# Patient Record
Sex: Male | Born: 1995 | Race: Black or African American | Hispanic: No | Marital: Single | Smoking: Never smoker
Health system: Southern US, Community
[De-identification: ages and names within clinical notes are randomized; demographics above are authoritative.]

---
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
## 2018-10-15 ENCOUNTER — Emergency Department (HOSPITAL_BASED_OUTPATIENT_CLINIC_OR_DEPARTMENT_OTHER)
Admission: EM | Admit: 2018-10-15 | Discharge: 2018-10-15 | Disposition: A | Discharge status: Home / Self Care | Payer: BC Managed Care – PPO | Attending: Emergency Medicine | Admitting: Emergency Medicine

## 2018-10-15 ENCOUNTER — Other Ambulatory Visit: Payer: Self-pay

## 2018-10-15 ENCOUNTER — Encounter (HOSPITAL_BASED_OUTPATIENT_CLINIC_OR_DEPARTMENT_OTHER): Payer: Self-pay | Admitting: Emergency Medicine

## 2018-10-15 DIAGNOSIS — Y998 Other external cause status: Secondary | ICD-10-CM | POA: Diagnosis not present

## 2018-10-15 DIAGNOSIS — Y9389 Activity, other specified: Secondary | ICD-10-CM | POA: Insufficient documentation

## 2018-10-15 DIAGNOSIS — S199XXA Unspecified injury of neck, initial encounter: Secondary | ICD-10-CM | POA: Diagnosis present

## 2018-10-15 DIAGNOSIS — S161XXA Strain of muscle, fascia and tendon at neck level, initial encounter: Secondary | ICD-10-CM | POA: Diagnosis not present

## 2018-10-15 DIAGNOSIS — Y92410 Unspecified street and highway as the place of occurrence of the external cause: Secondary | ICD-10-CM | POA: Insufficient documentation

## 2018-10-15 MED ORDER — METHOCARBAMOL 500 MG PO TABS: 500.0000 mg | ORAL_TABLET | Freq: Three times a day (TID) | ORAL | 0 refills | Status: AC | PRN

## 2018-10-15 NOTE — Discharge Instructions (Signed)
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
## 2020-03-18 ENCOUNTER — Encounter (HOSPITAL_BASED_OUTPATIENT_CLINIC_OR_DEPARTMENT_OTHER): Payer: Self-pay | Admitting: *Deleted

## 2020-03-18 ENCOUNTER — Emergency Department (HOSPITAL_BASED_OUTPATIENT_CLINIC_OR_DEPARTMENT_OTHER)
Admission: EM | Admit: 2020-03-18 | Discharge: 2020-03-18 | Disposition: A | Discharge status: Home / Self Care | Payer: BC Managed Care – PPO | Attending: Emergency Medicine | Admitting: Emergency Medicine

## 2020-03-18 ENCOUNTER — Other Ambulatory Visit: Payer: Self-pay

## 2020-03-18 DIAGNOSIS — H6123 Impacted cerumen, bilateral: Secondary | ICD-10-CM | POA: Insufficient documentation

## 2020-03-18 DIAGNOSIS — H9201 Otalgia, right ear: Secondary | ICD-10-CM | POA: Diagnosis present

## 2020-03-18 MED ORDER — DOCUSATE SODIUM 50 MG/5ML PO LIQD
50.0000 mg | Freq: Once | ORAL | Status: AC
  Administered 2020-03-18: 50 mg via OTIC
  Filled 2020-03-18: qty 10

## 2020-03-18 MED ORDER — CARBAMIDE PEROXIDE 6.5 % OT SOLN: 5.0000 [drp] | Freq: Two times a day (BID) | OTIC | 0 refills | Status: DC

## 2020-03-18 MED ORDER — CARBAMIDE PEROXIDE 6.5 % OT SOLN: 5.0000 [drp] | Freq: Two times a day (BID) | OTIC | 0 refills | Status: AC

## 2020-03-18 NOTE — ED Provider Notes (Signed)
[facility] EMERGENCY DEPARTMENT Provider Note   CSN: [id number] Arrival date & time: 03/18/20  1709     History Chief Complaint  Patient presents with  . Cerumen Impaction    Isaiah Davis is a 24 y.o. male.  Isaiah Davis is a 24 y.o. male who is otherwise healthy, presents to the ED for evaluation of muffled hearing in the right ear.  He reports he has a history of earwax buildup but is usually able to manage this with drops at home, has never had to come in to have wax removed.  He reports since this morning he has barely been able to hear out of his right ear.  He reports it is very muffled.  He reports some mild discomfort.  He has not noted any drainage from the ear.  No fevers or chills.  No other aggravating or alleviating factors.        History reviewed. No pertinent past medical history.  There are no problems to display for this patient.   History reviewed. No pertinent surgical history.     No family history on file.  Social History   Tobacco Use  . Smoking status: Never Smoker  . Smokeless tobacco: Never Used  Substance Use Topics  . Alcohol use: Never  . Drug use: Never    Home Medications Prior to Admission medications   Medication Sig Start Date End Date Taking? Authorizing Provider  carbamide peroxide (DEBROX) 6.5 % OTIC solution Place 5 drops into both ears 2 (two) times daily. 03/18/20   Dartha Lodge, PA-C  methocarbamol (ROBAXIN) 500 MG tablet Take 1 tablet (500 mg total) by mouth every 8 (eight) hours as needed for muscle spasms. 10/15/18   Couture, Cortni S, PA-C    Allergies    Patient has no known allergies.  Review of Systems   Review of Systems  Constitutional: Negative for chills and fever.  HENT: Positive for ear pain and hearing loss.     Physical Exam Updated Vital Signs BP (!) 143/97 (BP Location: Right Arm)   Pulse 77   Temp 98.5 F (36.9 C) (Oral)   Resp 16   Ht 5\' 10"  (1.778 m)   Wt 104.3 kg   SpO2  100%   BMI 33.00 kg/m   Physical Exam Vitals and nursing note reviewed.  Constitutional:      General: He is not in acute distress.    Appearance: Normal appearance. He is well-developed and normal weight. He is not ill-appearing or diaphoretic.  HENT:     Head: Normocephalic and atraumatic.     Ears:     Comments: Right ear canal with large amount of wax buildup, unable to visualize TM at all. Left ear canal with moderate amount of dried wax buildup, able to visualize small portion of intact TM Eyes:     General:        Right eye: No discharge.        Left eye: No discharge.  Pulmonary:     Effort: Pulmonary effort is normal. No respiratory distress.  Musculoskeletal:        General: No deformity.  Skin:    General: Skin is warm and dry.  Neurological:     Mental Status: He is alert and oriented to person, place, and time.     Coordination: Coordination normal.  Psychiatric:        Mood and Affect: Mood normal.        Behavior: Behavior normal.  ED Results / Procedures / Treatments   Labs (all labs ordered are listed, but only abnormal results are displayed) Labs Reviewed - No data to display  EKG None  Radiology No results found.  Procedures .Ear Cerumen Removal  Date/Time: 03/18/2020 10:03 PM Performed by: Jacqlyn Larsen, PA-C Authorized by: Jacqlyn Larsen, PA-C   Consent:    Consent obtained:  Verbal   Consent given by:  Patient   Risks discussed:  Bleeding, infection, incomplete removal, TM perforation, pain and dizziness   Alternatives discussed:  No treatment Procedure details:    Location:  L ear and R ear   Procedure type: irrigation     Procedure type comment:  Currette used in R ear Post-procedure details:    Inspection:  Macerated skin and TM intact   Hearing quality:  Improved   Patient tolerance of procedure:  Tolerated well, no immediate complications   (including critical care time)  Medications Ordered in ED Medications  docusate  (COLACE) 50 MG/5ML liquid 50 mg (50 mg Both EARS Given 03/18/20 2037)    ED Course  I have reviewed the triage vital signs and the nursing notes.  Pertinent labs & imaging results that were available during my care of the patient were reviewed by me and considered in my medical decision making (see chart for details).    MDM Rules/Calculators/A&P                      24 year old male presents with muffled hearing in the right ear.  Exam consistent with cerumen impaction he also has some buildup in the left ear as well.  History of same but usually he is able to keep this under control with eardrops and has never had to come into the ED to have wax removed.  Colace solution applied to both ears and then they were irrigated, patient with some soft wax still present over the right TM, additional irrigation and debridement with curette, after which the eardrum is visible and intact, some erythema as expected after procedure, but otherwise TM is intact.  Patient reports improvement in his hearing.  Will discharge with prescription for Debrox drops.  Follow-up and return precautions discussed.  Patient expresses understanding and agreement.  Discharged home in good condition.  Final Clinical Impression(s) / ED Diagnoses Final diagnoses:  Bilateral impacted cerumen    Rx / DC Orders ED Discharge Orders         Ordered    carbamide peroxide (DEBROX) 6.5 % OTIC solution  2 times daily     03/18/20 2207           Jacqlyn Larsen, PA-C 03/18/20 2210    Hayden Rasmussen, MD 03/19/20 [phone]

## 2020-03-18 NOTE — ED Triage Notes (Signed)
Unable to hear out of his right ear. States in the past when this happens it is caused by ear wax.

## 2020-03-18 NOTE — Discharge Instructions (Signed)
Use eardrops as directed for the next 5 to 7 days to help with any additional wax buildup.  After that you can use eardrops 2-3 times a week to help prevent further buildup.

## 2020-03-18 NOTE — ED Notes (Signed)
  Bilateral ears irrigated with saline/hydrogen peroxide solution.  Large amount of cerumen flushed out bilaterally.  Patient states his hearing is better and his ear feels better.

## 2020-10-10 ENCOUNTER — Ambulatory Visit: Payer: No Typology Code available for payment source | Attending: Internal Medicine

## 2020-10-10 DIAGNOSIS — Z23 Encounter for immunization: Secondary | ICD-10-CM

## 2020-10-10 NOTE — Progress Notes (Signed)
   Covid-19 Vaccination Clinic  Name:  Isaiah Davis    MRN: [id number] DOB: 1996/06/20  10/10/2020  Mr. Calk was observed post Covid-19 immunization for 15 minutes without incident. He was provided with Vaccine Information Sheet and instruction to access the V-Safe system.   Mr. Gracia was instructed to call 911 with any severe reactions post vaccine: Marland Kitchen Difficulty breathing  . Swelling of face and throat  . A fast heartbeat  . A bad rash all over body  . Dizziness and weakness   Immunizations Administered    Name Date Dose VIS Date Route   Pfizer COVID-19 Vaccine 10/10/2020  3:08 PM 0.3 mL 08/10/2020 Intramuscular   Manufacturer: [employer], [employer]   Lot: [id number]   NDC: [id number]

## 2020-10-29 ENCOUNTER — Other Ambulatory Visit: Payer: Self-pay

## 2020-10-29 DIAGNOSIS — Z20822 Contact with and (suspected) exposure to covid-19: Secondary | ICD-10-CM

## 2020-11-01 LAB — NOVEL CORONAVIRUS, NAA: SARS-CoV-2, NAA: DETECTED — AB

## 2021-06-05 ENCOUNTER — Ambulatory Visit: Payer: BC Managed Care – PPO | Admitting: Podiatry

## 2021-06-05 ENCOUNTER — Encounter: Payer: Self-pay | Admitting: Podiatry

## 2021-06-05 ENCOUNTER — Other Ambulatory Visit: Payer: Self-pay

## 2021-06-05 DIAGNOSIS — B353 Tinea pedis: Secondary | ICD-10-CM

## 2021-06-05 DIAGNOSIS — H9190 Unspecified hearing loss, unspecified ear: Secondary | ICD-10-CM | POA: Insufficient documentation

## 2021-06-05 MED ORDER — KETOCONAZOLE 2 % EX CREA: 1.0000 "application " | TOPICAL_CREAM | Freq: Two times a day (BID) | CUTANEOUS | 2 refills | Status: AC

## 2021-06-05 MED ORDER — KETOCONAZOLE 2 % EX CREA: 1.0000 "application " | TOPICAL_CREAM | Freq: Two times a day (BID) | CUTANEOUS | 2 refills | Status: DC

## 2021-06-05 NOTE — Addendum Note (Signed)
Addended byLilian Kapur, Jady Braggs R on: 06/05/2021 02:20 PM   Modules accepted: Orders

## 2021-06-05 NOTE — Progress Notes (Signed)
  Subjective:  Patient ID: Isaiah Davis, male    DOB: [birth year],  MRN: [id number]  Chief Complaint  Patient presents with   Blister     np-painful blisters on both feet and 5th right toe and 4th left toe-req day/time-dr. Windy Fast polite refer    25 y.o. male presents with the above complaint. History confirmed with patient.   Objective:  Physical Exam: warm, good capillary refill, no trophic changes or ulcerative lesions, normal DP and PT pulses, normal sensory exam, and tinea pedis. Assessment:   1. Tinea pedis of both feet      Plan:  Patient was evaluated and treated and all questions answered.  Discussed the etiology and treatment options for tinea pedis.  Discussed topical and oral treatment.  Recommended topical treatment with 2% ketoconazole cream.  This was sent to the patient's pharmacy.  Also discussed appropriate foot hygiene, use of antifungal spray such as Tinactin in shoes, as well as cleaning foot surfaces such as showers and bathroom floors with bleach.  Will call if does not resolve in 4-6 weeks and plan for 4 week course of lamisil if necessary    Return if symptoms worsen or fail to improve.

## 2021-08-07 ENCOUNTER — Other Ambulatory Visit: Payer: Self-pay | Admitting: Internal Medicine

## 2021-08-07 ENCOUNTER — Ambulatory Visit
Admission: RE | Admit: 2021-08-07 | Discharge: 2021-08-07 | Disposition: A | Discharge status: Home / Self Care | Payer: BC Managed Care – PPO | Source: Ambulatory Visit | Attending: Internal Medicine | Admitting: Internal Medicine

## 2021-08-07 ENCOUNTER — Other Ambulatory Visit: Payer: Self-pay

## 2021-08-07 DIAGNOSIS — M79672 Pain in left foot: Secondary | ICD-10-CM

## 2022-09-28 IMAGING — DX DG FOOT 2V*L*
2 series · 2 of 2 positions shown · non-contrast
Comparison: None.

CLINICAL DATA: Left foot pain.  No injury.

EXAM:
LEFT FOOT - 2 VIEW

[dg foot 2 views left (1 of 2)]
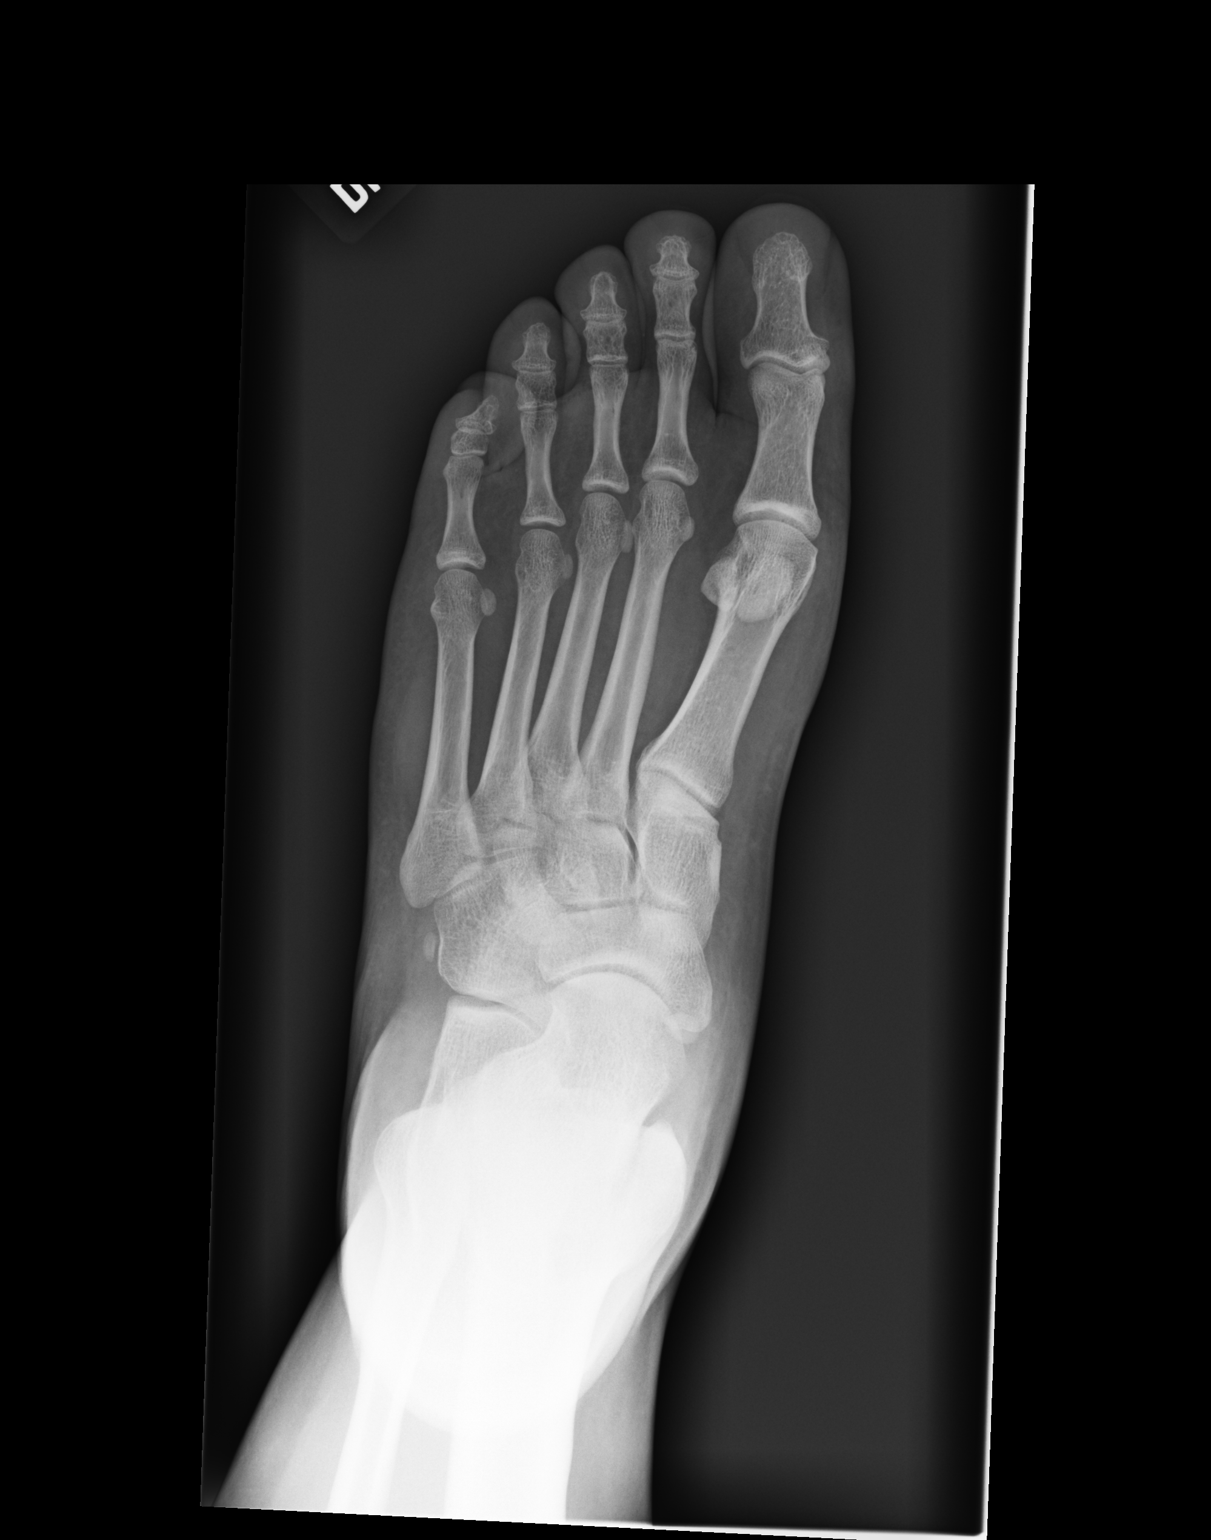

[dg foot 2 views left (2 of 2)]
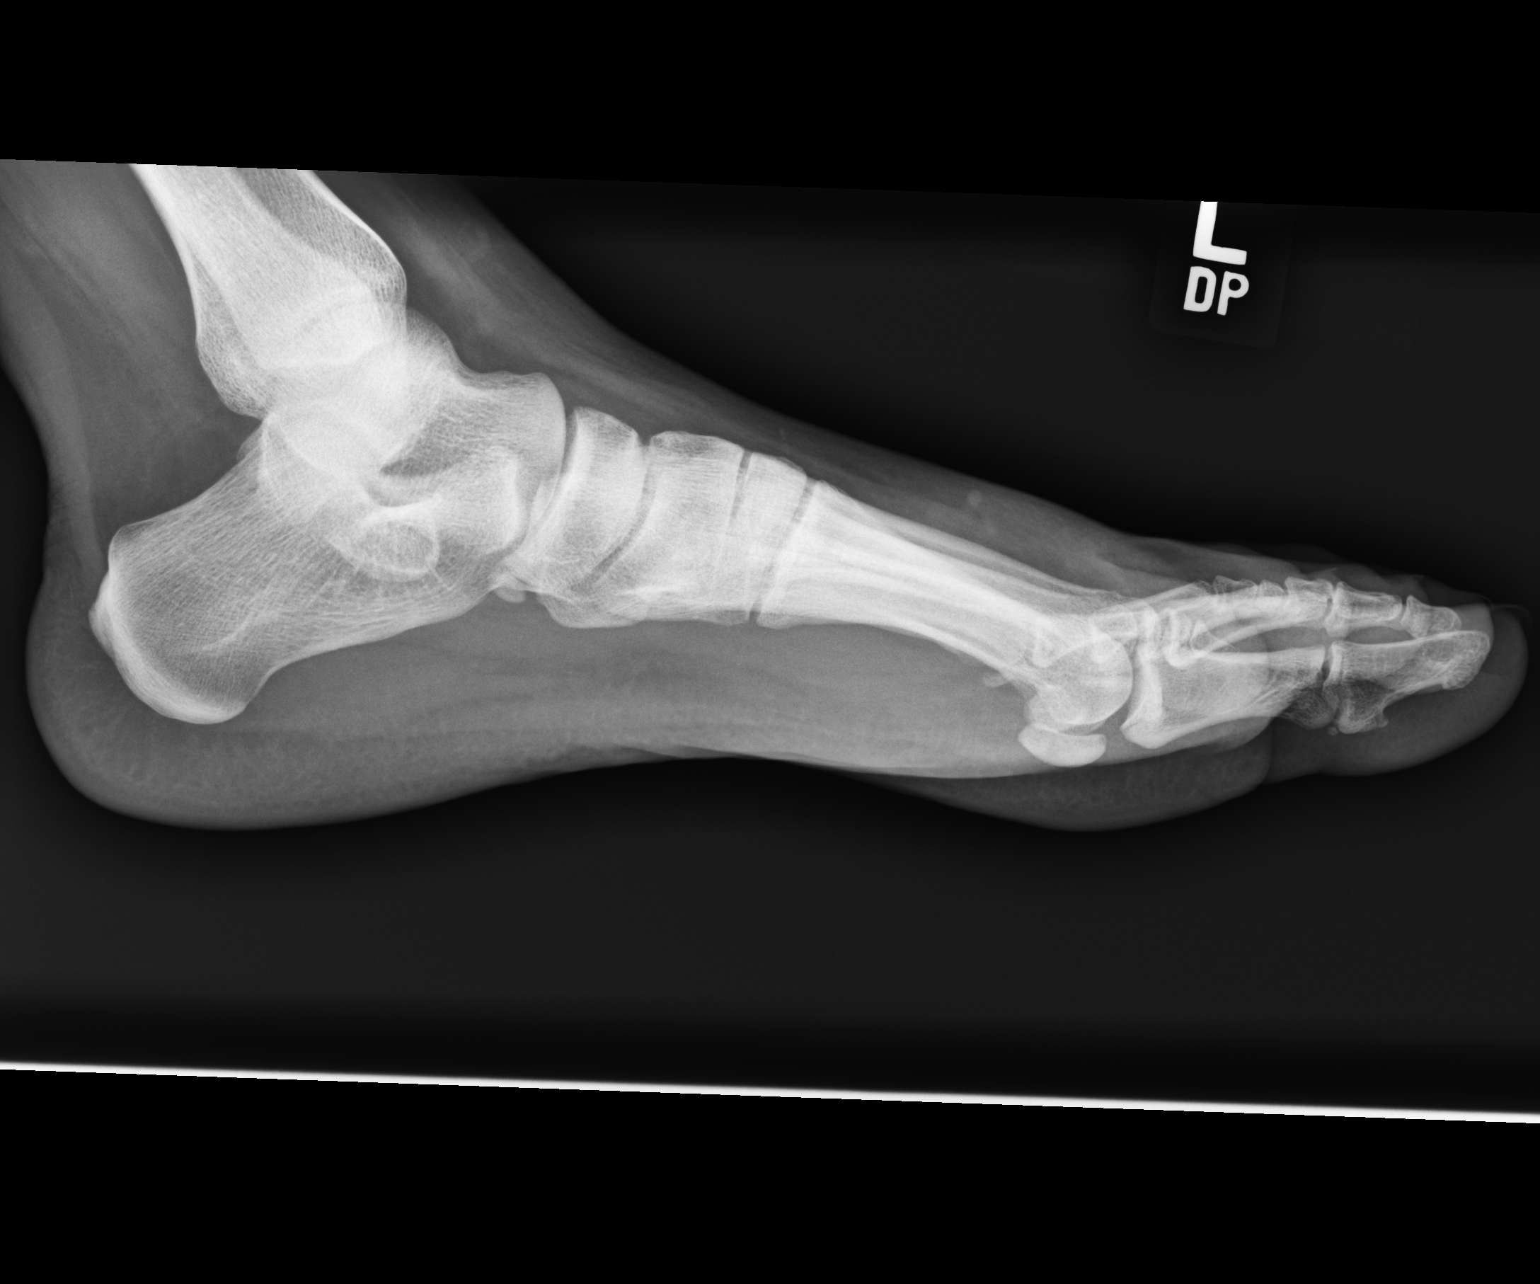

[2 of 2 positions shown; findings below may reference images not displayed]

FINDINGS: There is no evidence of fracture or dislocation. There is no
evidence of arthropathy or other focal bone abnormality. Soft
tissues are unremarkable.
IMPRESSION: Negative.

## 2023-03-04 ENCOUNTER — Other Ambulatory Visit (HOSPITAL_BASED_OUTPATIENT_CLINIC_OR_DEPARTMENT_OTHER): Payer: Self-pay

## 2023-03-04 MED ORDER — COMIRNATY 30 MCG/0.3ML IM SUSY
0.3000 mL | PREFILLED_SYRINGE | INTRAMUSCULAR | 0 refills | Status: AC
  Filled 2023-03-04: qty 0.3, 1d supply, fill #0

## 2023-04-26 ENCOUNTER — Other Ambulatory Visit (HOSPITAL_COMMUNITY): Payer: Self-pay
